# Patient Record
Sex: Male | Born: 1990 | Race: White | Hispanic: No | Marital: Single | Smoking: Current some day smoker
Health system: Southern US, Community
[De-identification: ages and names within clinical notes are randomized; demographics above are authoritative.]

---

## 2016-02-04 ENCOUNTER — Emergency Department (HOSPITAL_COMMUNITY)
Admission: EM | Admit: 2016-02-04 | Discharge: 2016-02-04 | Disposition: A | Discharge status: Home / Self Care | Payer: Self-pay | Attending: Emergency Medicine | Admitting: Emergency Medicine

## 2016-02-04 ENCOUNTER — Encounter (HOSPITAL_COMMUNITY): Payer: Self-pay

## 2016-02-04 ENCOUNTER — Emergency Department (HOSPITAL_COMMUNITY): Payer: Self-pay

## 2016-02-04 DIAGNOSIS — F17200 Nicotine dependence, unspecified, uncomplicated: Secondary | ICD-10-CM | POA: Insufficient documentation

## 2016-02-04 DIAGNOSIS — R1084 Generalized abdominal pain: Secondary | ICD-10-CM

## 2016-02-04 DIAGNOSIS — K5900 Constipation, unspecified: Secondary | ICD-10-CM | POA: Insufficient documentation

## 2016-02-04 LAB — COMPREHENSIVE METABOLIC PANEL
ALBUMIN: 4.4 g/dL (ref 3.5–5.0)
ALT: 21 U/L (ref 17–63)
ANION GAP: 9 (ref 5–15)
AST: 24 U/L (ref 15–41)
Alkaline Phosphatase: 96 U/L (ref 38–126)
BUN: 12 mg/dL (ref 6–20)
CHLORIDE: 104 mmol/L (ref 101–111)
CO2: 25 mmol/L (ref 22–32)
Calcium: 9.9 mg/dL (ref 8.9–10.3)
Creatinine, Ser: 0.98 mg/dL (ref 0.61–1.24)
GFR calc Af Amer: >60 mL/min (ref >60)
GFR calc non Af Amer: >60 mL/min (ref >60)
GLUCOSE: 109 mg/dL — AB (ref 65–99)
POTASSIUM: 3.9 mmol/L (ref 3.5–5.1)
Sodium: 138 mmol/L (ref 135–145)
TOTAL PROTEIN: 6.6 g/dL (ref 6.5–8.1)
Total Bilirubin: 2 mg/dL — ABNORMAL HIGH (ref 0.3–1.2)

## 2016-02-04 LAB — CBC
HEMATOCRIT: 40.8 % (ref 39.0–52.0)
HEMOGLOBIN: 14.8 g/dL (ref 13.0–17.0)
MCH: 32.4 pg (ref 26.0–34.0)
MCHC: 36.3 g/dL — AB (ref 30.0–36.0)
MCV: 89.3 fL (ref 78.0–100.0)
Platelets: 188 10*3/uL (ref 150–400)
RBC: 4.57 MIL/uL (ref 4.22–5.81)
RDW: 13.4 % (ref 11.5–15.5)
WBC: 9.7 10*3/uL (ref 4.0–10.5)

## 2016-02-04 LAB — LIPASE, BLOOD: LIPASE: 22 U/L (ref 11–51)

## 2016-02-04 LAB — URINALYSIS, ROUTINE W REFLEX MICROSCOPIC
Bilirubin Urine: NEGATIVE
GLUCOSE, UA: NEGATIVE mg/dL
Hgb urine dipstick: NEGATIVE
KETONES UR: NEGATIVE mg/dL
LEUKOCYTES UA: NEGATIVE
NITRITE: NEGATIVE
PH: 7.5 (ref 5.0–8.0)
Protein, ur: NEGATIVE mg/dL
SPECIFIC GRAVITY, URINE: 1.026 (ref 1.005–1.030)

## 2016-02-04 MED ORDER — IOPAMIDOL (ISOVUE-300) INJECTION 61%
INTRAVENOUS | Status: AC
  Administered 2016-02-04: 100 mL
  Filled 2016-02-04: qty 100

## 2016-02-04 MED ORDER — ONDANSETRON HCL 4 MG/2ML IJ SOLN
4.0000 mg | Freq: Once | INTRAMUSCULAR | Status: AC
  Administered 2016-02-04: 4 mg via INTRAVENOUS
  Filled 2016-02-04: qty 2

## 2016-02-04 MED ORDER — POLYETHYLENE GLYCOL 3350 17 G PO PACK: 17.0000 g | PACK | Freq: Every day | ORAL | 0 refills | Status: AC

## 2016-02-04 MED ORDER — MORPHINE SULFATE (PF) 4 MG/ML IV SOLN
6.0000 mg | Freq: Once | INTRAVENOUS | Status: AC
  Administered 2016-02-04: 6 mg via INTRAVENOUS
  Filled 2016-02-04: qty 2

## 2016-02-04 MED ORDER — SODIUM CHLORIDE 0.9 % IV BOLUS (SEPSIS)
1000.0000 mL | Freq: Once | INTRAVENOUS | Status: AC
  Administered 2016-02-04: 1000 mL via INTRAVENOUS

## 2016-02-04 NOTE — ED Notes (Signed)
EDP at bedside  

## 2016-02-04 NOTE — ED Notes (Signed)
Patient transported to CT 

## 2016-02-04 NOTE — ED Provider Notes (Signed)
[facility]-EMERGENCY DEPT Provider Note   CSN: [id number] Arrival date & time: 02/04/16  1431     History   Chief Complaint Chief Complaint  Patient presents with  . Abdominal Pain    HPI Steven Neal is a 25 y.o. male.   Abdominal Pain   This is a new problem. The current episode started 3 to 5 hours ago. The problem occurs constantly. The pain is located in the generalized abdominal region. The quality of the pain is aching and sharp. The pain is severe. Associated symptoms include vomiting. Pertinent negatives include anorexia, fever and nausea. The symptoms are aggravated by certain positions and activity. Nothing relieves the symptoms.    History reviewed. No pertinent past medical history.  There are no active problems to display for this patient.   History reviewed. No pertinent surgical history.     Home Medications    Prior to Admission medications   Medication Sig Start Date End Date Taking? Authorizing Provider  polyethylene glycol (MIRALAX / GLYCOLAX) packet Take 17 g by mouth daily. 02/04/16   Marily Memos, MD    Family History History reviewed. No pertinent family history.  Social History Social History  Substance Use Topics  . Smoking status: Current Some Day Smoker  . Smokeless tobacco: Never Used  . Alcohol use Not on file     Allergies   Review of patient's allergies indicates no known allergies.   Review of Systems Review of Systems  Constitutional: Positive for appetite change. Negative for chills and fever.  Gastrointestinal: Positive for abdominal pain and vomiting. Negative for anorexia and nausea.  All other systems reviewed and are negative.    Physical Exam Updated Vital Signs BP 131/81   Pulse 81   Temp 98 F (36.7 C) (Oral)   Resp 15   Ht 6\' 4"  (1.93 m)   Wt 162 lb (73.5 kg)   SpO2 99%   BMI 19.72 kg/m   Physical Exam  Constitutional: He is oriented to person, place, and time. He appears well-developed and  well-nourished.  HENT:  Head: Normocephalic and atraumatic.  Eyes: Conjunctivae are normal.  Neck: Normal range of motion. Neck supple.  Cardiovascular: Normal rate and regular rhythm.   No murmur heard. Pulmonary/Chest: Effort normal and breath sounds normal. No respiratory distress. He has no wheezes. He has no rales.  Abdominal: Soft. Bowel sounds are normal. He exhibits no distension and no mass. There is no tenderness. There is guarding. There is no rebound.  Musculoskeletal: Normal range of motion. He exhibits no edema or deformity.  Neurological: He is alert and oriented to person, place, and time.  Skin: Skin is warm and dry.  Psychiatric: He has a normal mood and affect.  Nursing note and vitals reviewed.    ED Treatments / Results  Labs (all labs ordered are listed, but only abnormal results are displayed) Labs Reviewed  COMPREHENSIVE METABOLIC PANEL - Abnormal; Notable for the following:       Result Value   Glucose, Bld 109 (*)    Total Bilirubin 2.0 (*)    All other components within normal limits  CBC - Abnormal; Notable for the following:    MCHC 36.3 (*)    All other components within normal limits  LIPASE, BLOOD  URINALYSIS, ROUTINE W REFLEX MICROSCOPIC (NOT AT [facility])    EKG  EKG Interpretation None       Radiology Ct Abdomen Pelvis W Contrast  Result Date: 02/04/2016 CLINICAL DATA:  Bilateral lower abdominal  pain especially on the right side starting today. EXAM: CT ABDOMEN AND PELVIS WITH CONTRAST TECHNIQUE: Multidetector CT imaging of the abdomen and pelvis was performed using the standard protocol following bolus administration of intravenous contrast. CONTRAST:  100mL ISOVUE-300 IOPAMIDOL (ISOVUE-300) INJECTION 61% COMPARISON:  None. FINDINGS: Lower chest: Unremarkable Hepatobiliary: Unremarkable Pancreas: Incomplete pancreas divisum. Spleen: Splenic volume 410 cc, upper normal. Adrenals/Urinary Tract: 6 mm hypodense right mid kidney lesion, image  32/201. 1.1 cm left mid kidney hypodense lesion, image 29/201, internal density 21 Hounsfield units on portal venous phase images. This is the same density as fluid in the gallbladder. No urinary tract calculi are identified. Stomach/Bowel: Prominent stool throughout the colon favors constipation. Appendix normal. No dilated bowel. Vascular/Lymphatic: Unremarkable Reproductive: Unremarkable Other: No supplemental non-categorized findings. Musculoskeletal: Unremarkable IMPRESSION: 1.  Prominent stool throughout the colon favors constipation. 2. Incomplete pancreas divisum. No current findings of pancreatitis by imaging. 3. Several hypodense renal lesions are present, a left mid kidney lesion is compatible with a cyst and a right mid kidney lesion at 6 mm cyst is technically too small to characterize although statistically highly likely to be a benign cyst. Electronically Signed   By: Gaylyn RongWalter  Liebkemann M.D.   On: 02/04/2016 18:19    Procedures Procedures (including critical care time)  Medications Ordered in ED Medications  morphine 4 MG/ML injection 6 mg (6 mg Intravenous Given 02/04/16 1643)  sodium chloride 0.9 % bolus 1,000 mL (0 mLs Intravenous Stopped 02/04/16 1726)  ondansetron (ZOFRAN) injection 4 mg (4 mg Intravenous Given 02/04/16 1642)  iopamidol (ISOVUE-300) 61 % injection (100 mLs  Contrast Given 02/04/16 1750)     Initial Impression / Assessment and Plan / ED Course  I have reviewed the triage vital signs and the nursing notes.  Pertinent labs & imaging results that were available during my care of the patient were reviewed by me and considered in my medical decision making (see chart for details).  Clinical Course  Value Comment By Time  CT ABDOMEN PELVIS W CONTRAST (Reviewed) Marily MemosJason Mirai Greenwood, MD 10/04 1722  CT ABDOMEN PELVIS W CONTRAST (Reviewed) Marily MemosJason Clelia Trabucco, MD 10/04 1828  CT ABDOMEN PELVIS W CONTRAST (Reviewed) Marily MemosJason Brionne Mertz, MD 10/04 1828   concernign abdominal exam, plan for CT.  Could be gas v colitis v enteritis v perforation v appy Pain completely resolved. Repeat exam benign, no e/o peritonitis. Ct negative for acute findings. Plan for dc and miralax. Strict return precautions provided.   Final Clinical Impressions(s) / ED Diagnoses   Final diagnoses:  Generalized abdominal pain  Constipation, unspecified constipation type    New Prescriptions Discharge Medication List as of 02/04/2016  6:38 PM    START taking these medications   Details  polyethylene glycol (MIRALAX / GLYCOLAX) packet Take 17 g by mouth daily., Starting Wed 02/04/2016, Print         Marily MemosJason Minnie Legros, MD 02/04/16 [phone]

## 2016-02-04 NOTE — Discharge Instructions (Signed)
MiraLAX is an osmotic laxative. This means that it will keep electrolytes and water in your stool and allow you to have easier bowel movements. You  can take this medication up to 3 times daily as needed produce bowel movements. However while you're taking this much MiraLAX you need to make sure you are replenishing her electrolytes and water loss. He should also not take it more than 2 days in a row at this level. Generally I recommend people workup to this. For example take 1 capful of MiraLAX once a day for 2-3 days and if you do not get improvement in your bowel habits then take 1 capful twice a day for 2-3 days and if they don't get relief from this and you can take 1 capful 3 times a day for 2 days. If you start having diarrhea, decrease the amount of MiraLAX you are using until you have soft formed stools once to twice a day. Remember during this process that you need to increase your electrolytes and water intake, so oftentimes sports drinks are good for this. Some people end up taking half or one whole capful daily for the rest of their lives to have regular bowel movements you can do this as you feel is proper.

## 2016-02-04 NOTE — ED Triage Notes (Signed)
Pt presents with onset of umbilical pain that is now generalized.  +nausea and vomiting; denies diarrhea - last bowel movement was this morning; denies dysuria.  Pt reports pain has been constant and radiates around to both flank.

## 2018-04-03 IMAGING — CT CT ABD-PELV W/ CM
2 of 4 series · 11 of 46 positions shown, 12 images · IV contrast (Iodine)
Comparison: None.

CLINICAL DATA: Bilateral lower abdominal pain especially on the
right side starting today.

EXAM:
CT ABDOMEN AND PELVIS WITH CONTRAST
TECHNIQUE: Multidetector CT imaging of the abdomen and pelvis was performed
using the standard protocol following bolus administration of
intravenous contrast.
CONTRAST:  100mL TQ9UV6-1CC IOPAMIDOL (TQ9UV6-1CC) INJECTION 61%

[Series 201: routine, idose (2) · axial · 0.70mm/px · z∈[-689,-294]mm · 8 of 97 slices shown, 9 images]
[im 9/97  soft-tissue]
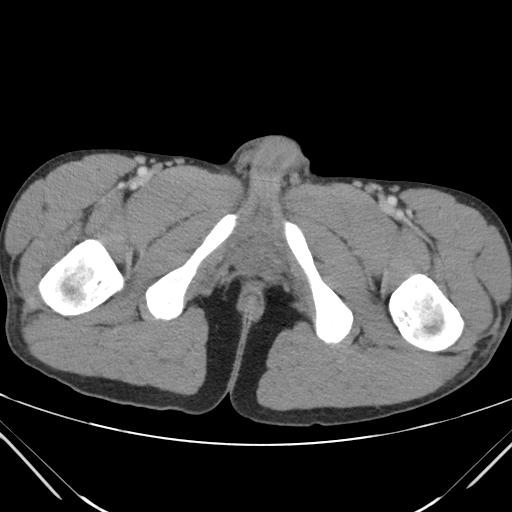
[im 9/97  bone]
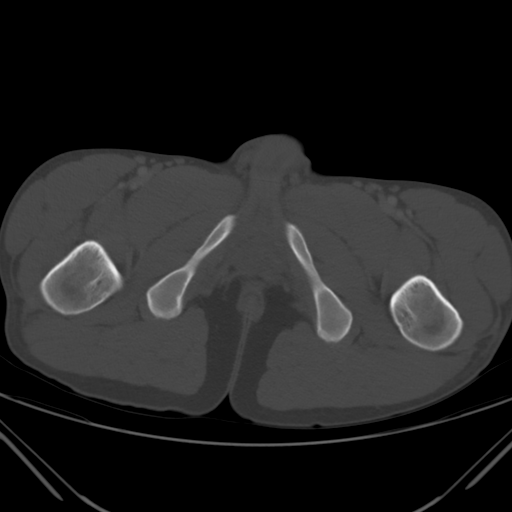
[im 21/97  soft-tissue]
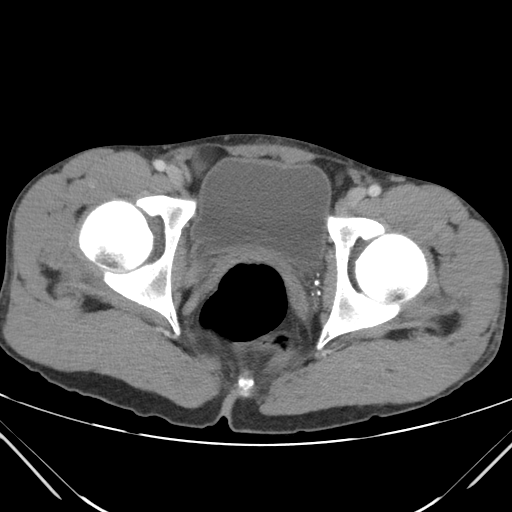
[im 30/97  soft-tissue]
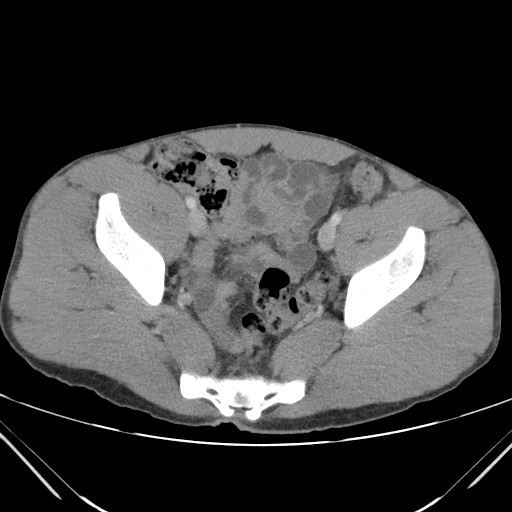
[im 42/97  soft-tissue]
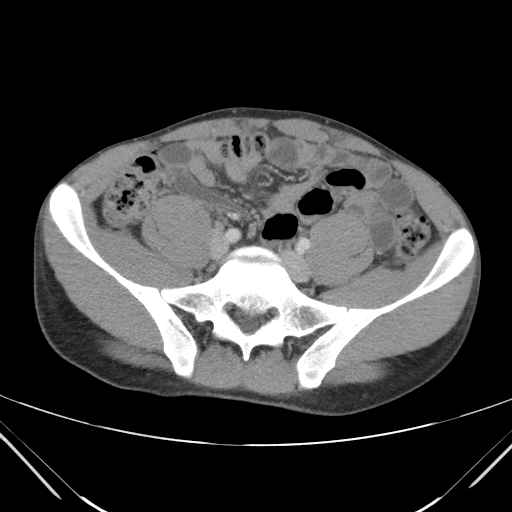
[im 55/97  soft-tissue]
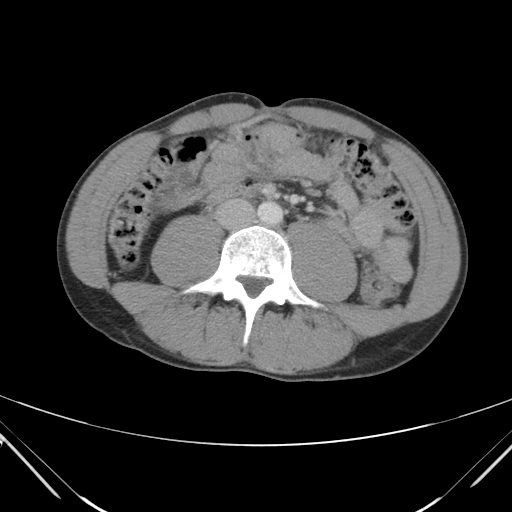
[im 67/97  soft-tissue]
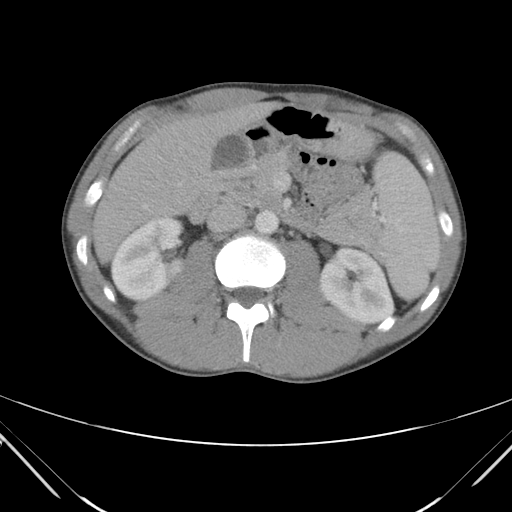
[im 76/97  soft-tissue]
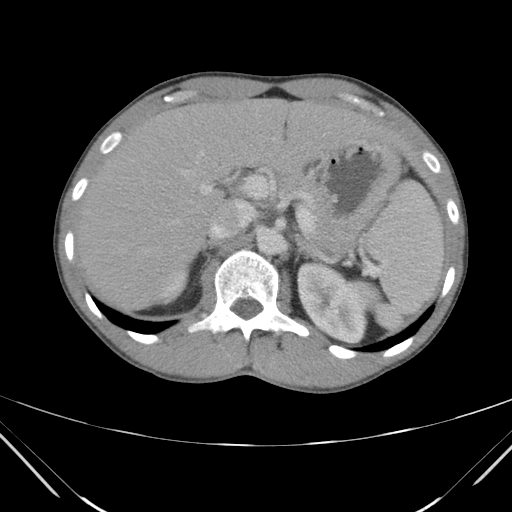
[im 88/97  soft-tissue]
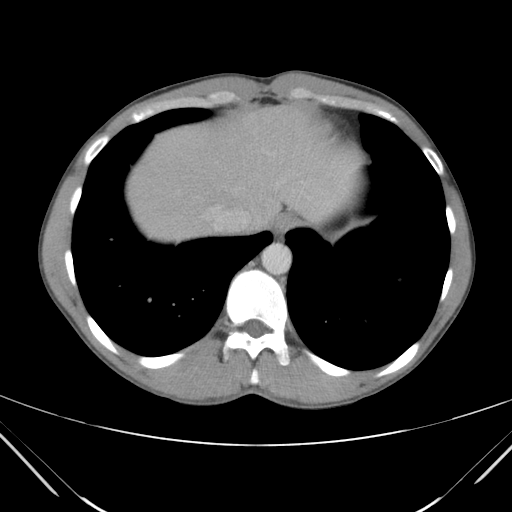

[Series 203: coronals, idose (2) · coronal · 0.45mm/px · 3 of 124 slices shown]
[im 42/124  soft-tissue]
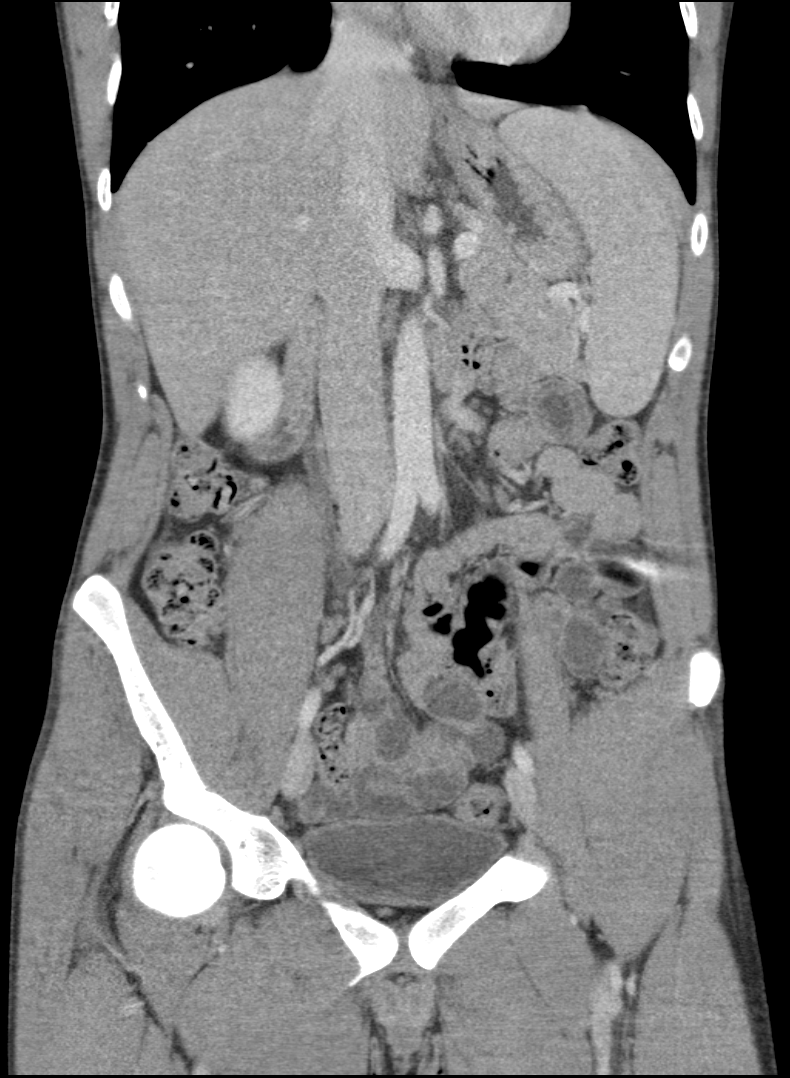
[im 55/124  soft-tissue]
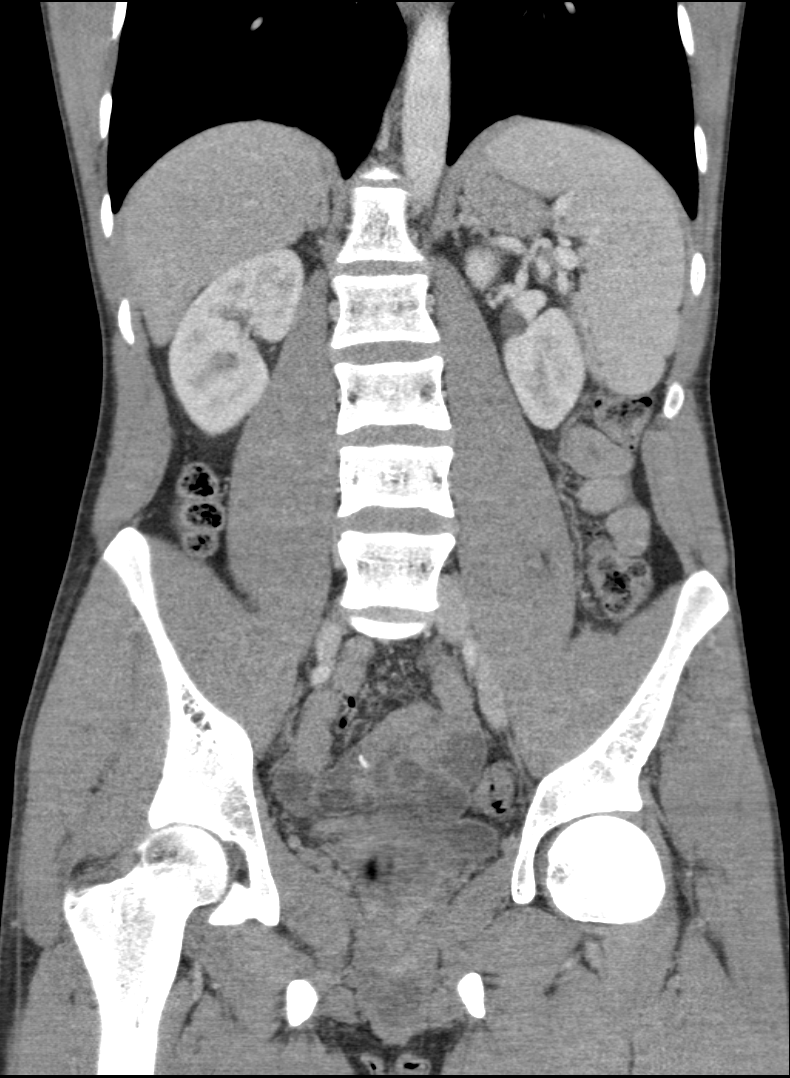
[im 69/124  soft-tissue]
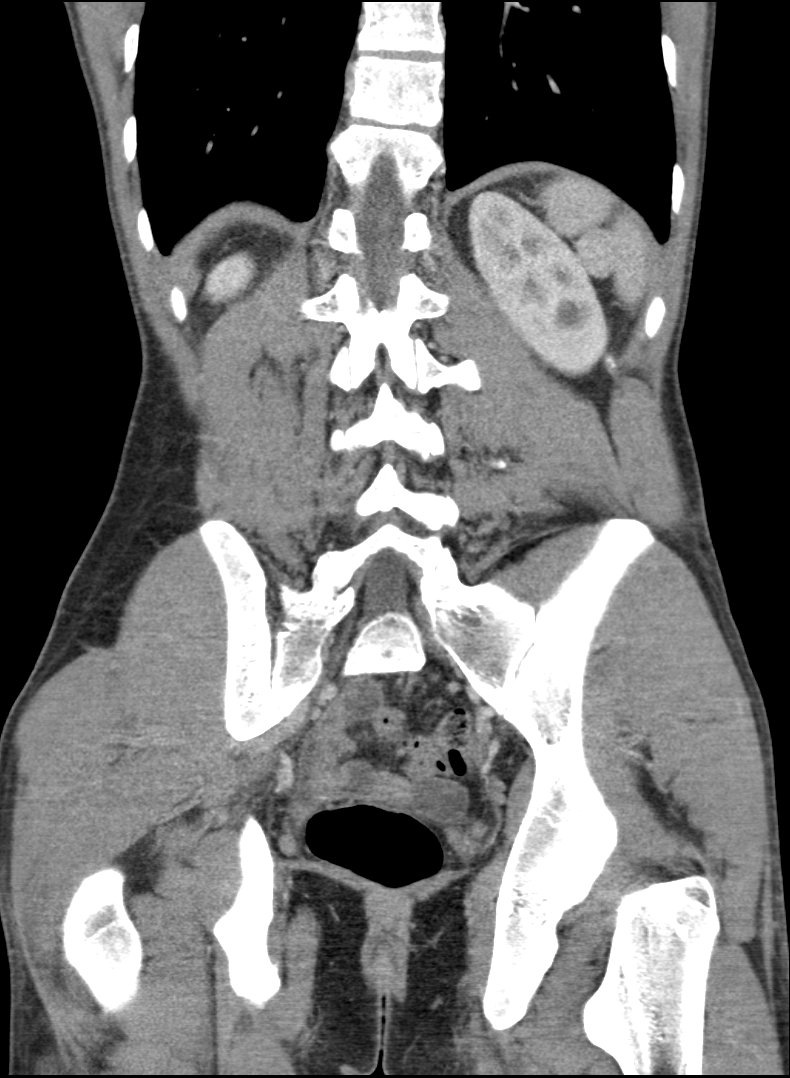

[11 of 46 positions shown; findings below may reference images not displayed]

FINDINGS: Lower chest: Unremarkable

Hepatobiliary: Unremarkable

Pancreas: Incomplete pancreas divisum.

Spleen: Splenic volume 410 cc, upper normal.

Adrenals/Urinary Tract: 6 mm hypodense right mid kidney lesion,
image 32/201. 1.1 cm left mid kidney hypodense lesion, image 29/201,
internal density 21 Hounsfield units on portal venous phase images.
This is the same density as fluid in the gallbladder. No urinary
tract calculi are identified.

Stomach/Bowel: Prominent stool throughout the colon favors
constipation. Appendix normal. No dilated bowel.

Vascular/Lymphatic: Unremarkable

Reproductive: Unremarkable

Other: No supplemental non-categorized findings.

Musculoskeletal: Unremarkable
IMPRESSION: 1.  Prominent stool throughout the colon favors constipation.
2. Incomplete pancreas divisum. No current findings of pancreatitis
by imaging.
3. Several hypodense renal lesions are present, a left mid kidney
lesion is compatible with a cyst and a right mid kidney lesion at 6
mm cyst is technically too small to characterize although
statistically highly likely to be a benign cyst.
